# Patient Record
Sex: Male | Born: 1995 | Race: White | Hispanic: No | Marital: Single | Smoking: Never smoker
Health system: Southern US, Community
[De-identification: ages and names within clinical notes are randomized; demographics above are authoritative.]

## PROBLEM LIST (undated history)

## (undated) HISTORY — PX: MOUTH SURGERY: SHX715

## (undated) HISTORY — PX: HERNIA REPAIR: SHX51

---
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
## 2016-04-03 IMAGING — DX DG FOOT COMPLETE 3+V*L*
3 series · 3 of 3 positions shown · non-contrast
Comparison: None.

CLINICAL DATA: Rolled left ankle while playing basketball, pain
ankle and foot

EXAM:
LEFT FOOT - COMPLETE 3+ VIEW

[foot ap]
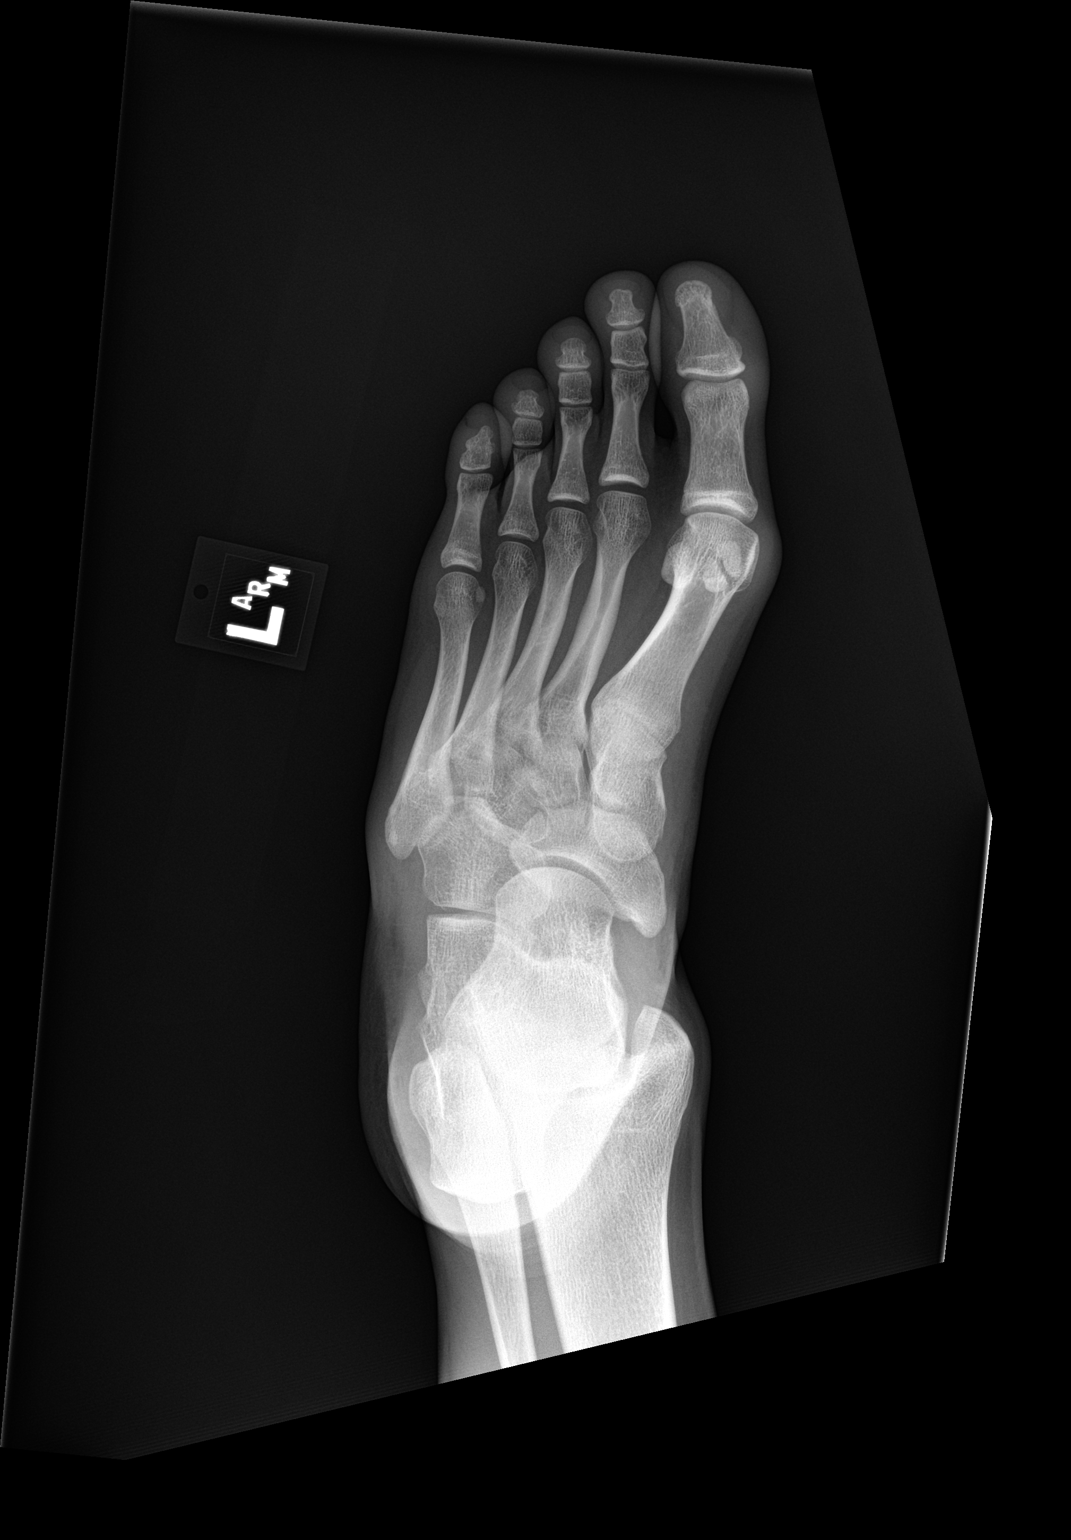

[foot obl]
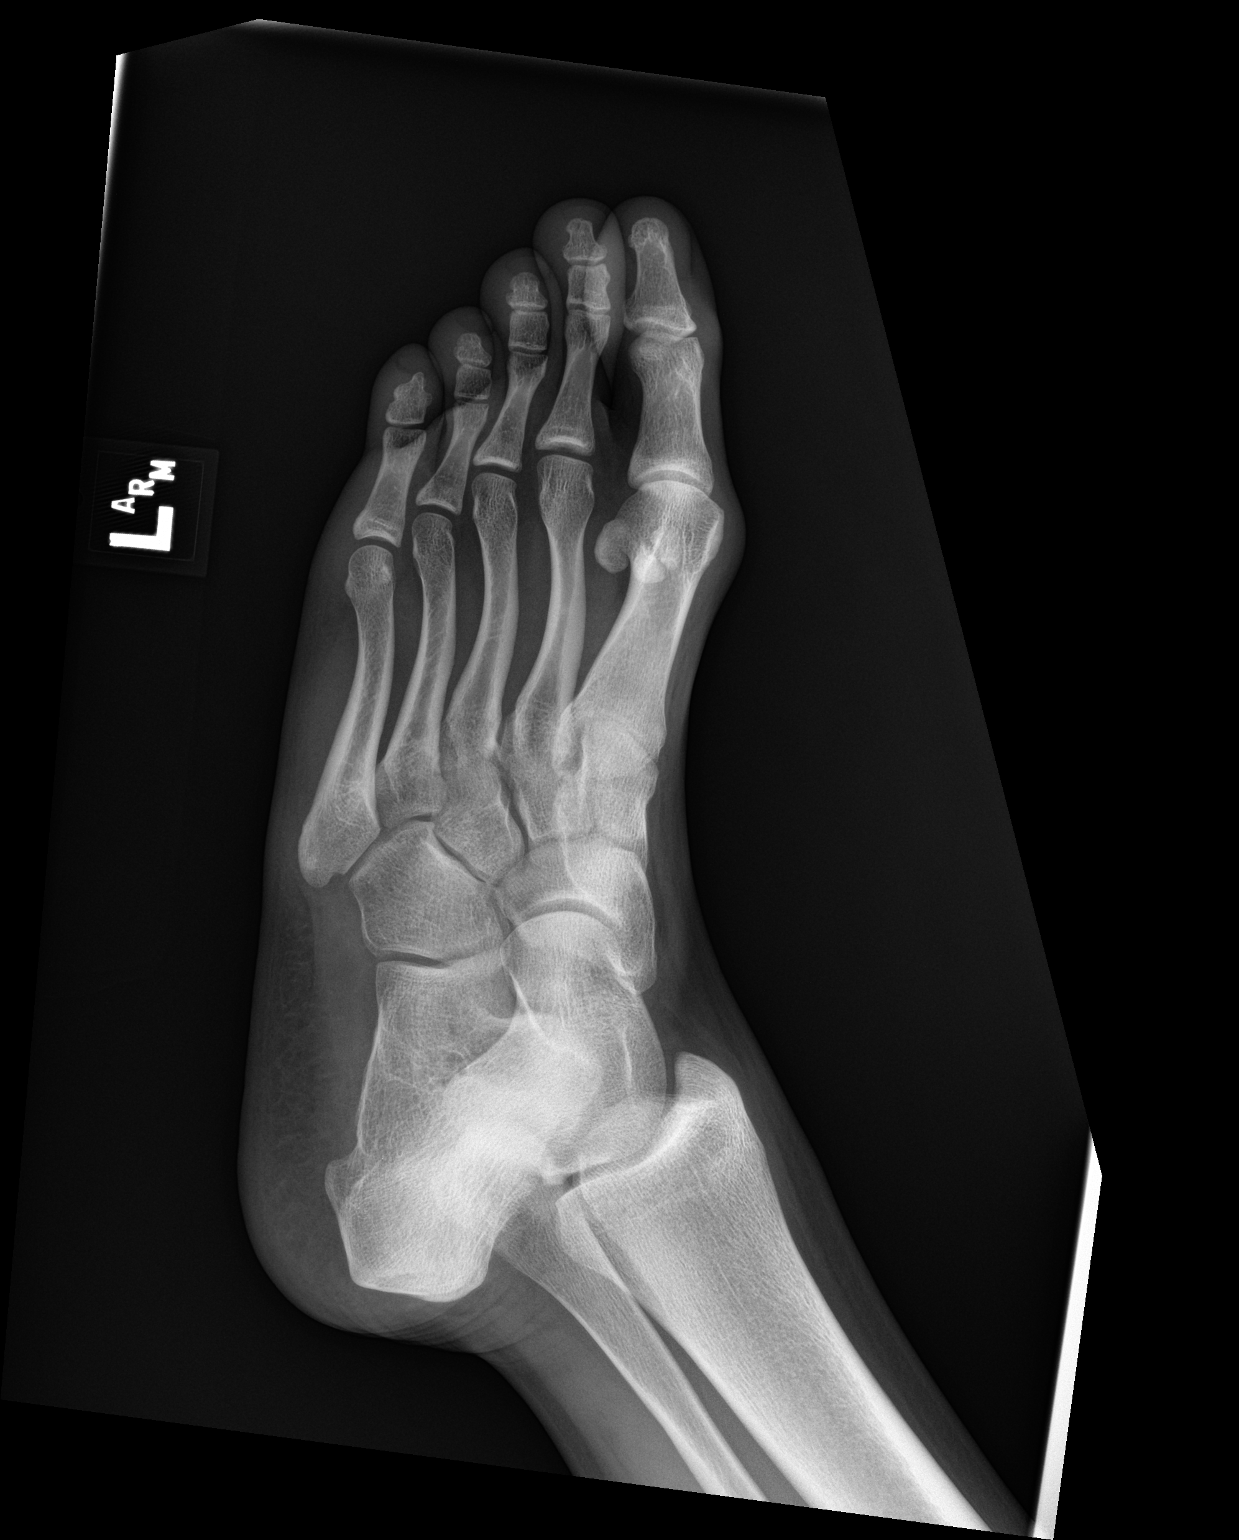

[foot lat]
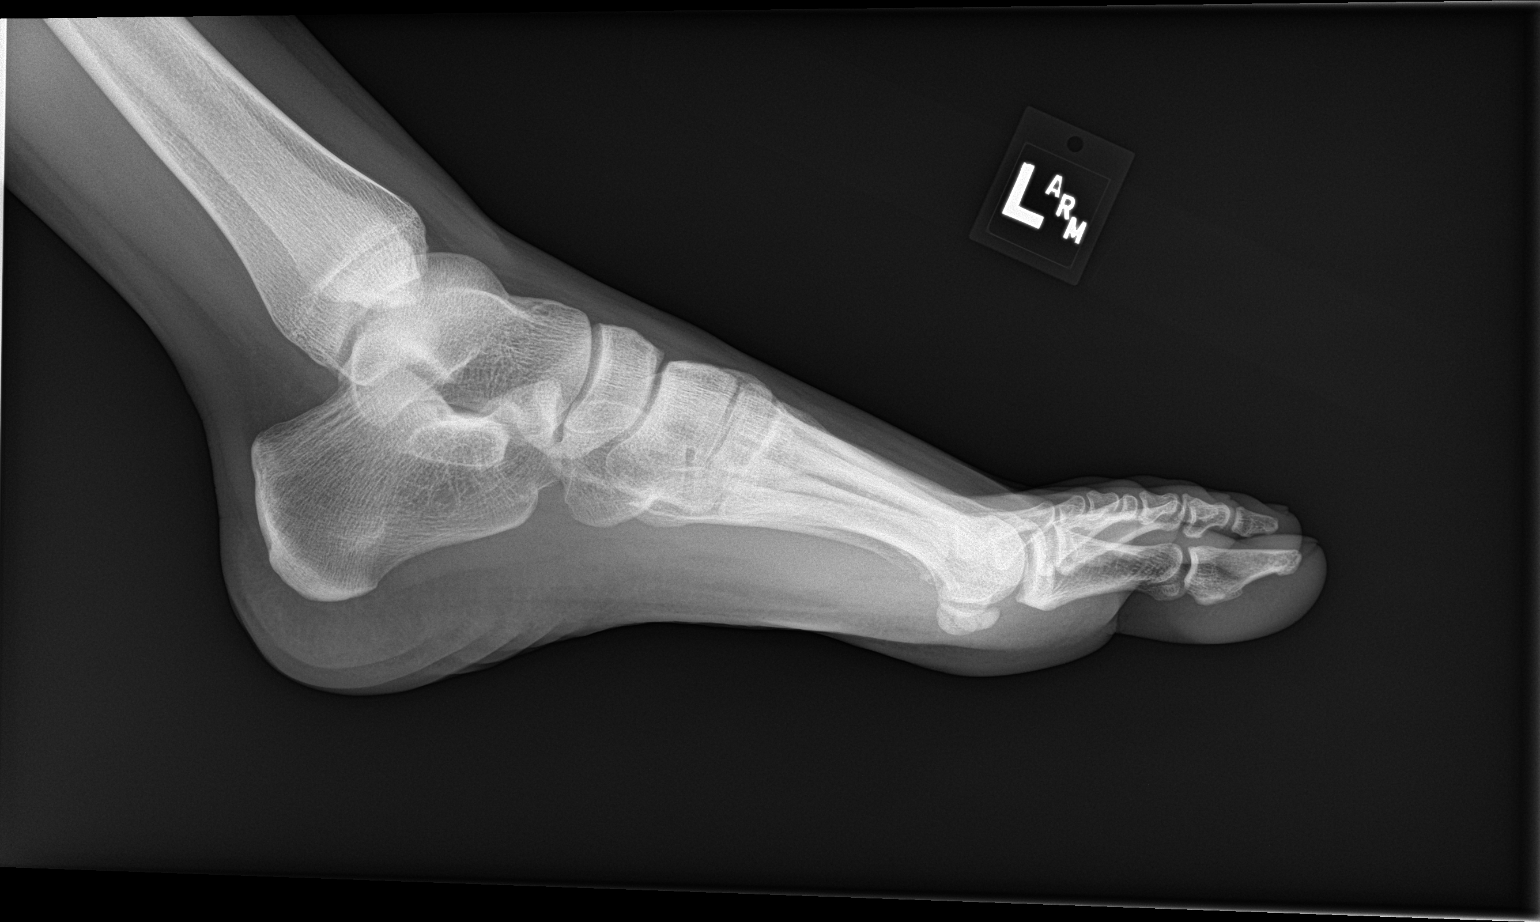

[3 of 3 positions shown; findings below may reference images not displayed]

FINDINGS: There is no evidence of fracture or dislocation. There is no
evidence of arthropathy or other focal bone abnormality. Soft
tissues are unremarkable.
IMPRESSION: Negative.
